# Patient Record
Sex: Male | Born: 1970 | Race: Black or African American | Hispanic: No | Marital: Single | Smoking: Current every day smoker
Health system: Southern US, Community
[De-identification: ages and names within clinical notes are randomized; demographics above are authoritative.]

---

## 1998-04-05 ENCOUNTER — Encounter: Admission: RE | Admit: 1998-04-05 | Discharge: 1998-04-05 | Payer: Self-pay | Admitting: Family Medicine

## 2000-09-29 ENCOUNTER — Emergency Department (HOSPITAL_COMMUNITY): Admission: EM | Admit: 2000-09-29 | Discharge: 2000-09-29 | Payer: Self-pay | Admitting: Emergency Medicine

## 2002-04-26 ENCOUNTER — Encounter: Payer: Self-pay | Admitting: Orthopedic Surgery

## 2002-04-26 ENCOUNTER — Inpatient Hospital Stay (HOSPITAL_COMMUNITY): Admission: EM | Admit: 2002-04-26 | Discharge: 2002-04-27 | Payer: Self-pay | Admitting: Emergency Medicine

## 2002-04-26 ENCOUNTER — Encounter: Payer: Self-pay | Admitting: Emergency Medicine

## 2002-07-21 ENCOUNTER — Encounter: Admission: RE | Admit: 2002-07-21 | Discharge: 2002-10-10 | Payer: Self-pay | Admitting: Orthopedic Surgery

## 2007-02-15 ENCOUNTER — Emergency Department (HOSPITAL_COMMUNITY): Admission: EM | Admit: 2007-02-15 | Discharge: 2007-02-15 | Payer: Self-pay | Admitting: Emergency Medicine

## 2013-02-10 ENCOUNTER — Emergency Department (INDEPENDENT_AMBULATORY_CARE_PROVIDER_SITE_OTHER)
Admission: EM | Admit: 2013-02-10 | Discharge: 2013-02-10 | Disposition: A | Discharge status: Nursing Home (Includes ICF) | Payer: Self-pay | Source: Home / Self Care | Attending: Emergency Medicine | Admitting: Emergency Medicine

## 2013-02-10 ENCOUNTER — Emergency Department (HOSPITAL_COMMUNITY)
Admission: EM | Admit: 2013-02-10 | Discharge: 2013-02-10 | Disposition: A | Discharge status: Home / Self Care | Payer: Self-pay | Attending: Emergency Medicine | Admitting: Emergency Medicine

## 2013-02-10 ENCOUNTER — Encounter (HOSPITAL_COMMUNITY): Payer: Self-pay | Admitting: Emergency Medicine

## 2013-02-10 DIAGNOSIS — J36 Peritonsillar abscess: Secondary | ICD-10-CM | POA: Insufficient documentation

## 2013-02-10 DIAGNOSIS — F17200 Nicotine dependence, unspecified, uncomplicated: Secondary | ICD-10-CM | POA: Insufficient documentation

## 2013-02-10 LAB — BASIC METABOLIC PANEL
BUN: 9 mg/dL (ref 6–23)
CO2: 27 mEq/L (ref 19–32)
Calcium: 9.8 mg/dL (ref 8.4–10.5)
Chloride: 100 mEq/L (ref 96–112)
Creatinine, Ser: 0.82 mg/dL (ref 0.50–1.35)
GFR calc Af Amer: >90 mL/min (ref >90)
GFR calc non Af Amer: >90 mL/min (ref >90)
Glucose, Bld: 82 mg/dL (ref 70–99)
Potassium: 4.4 mEq/L (ref 3.5–5.1)
Sodium: 137 mEq/L (ref 135–145)

## 2013-02-10 LAB — CBC WITH DIFFERENTIAL/PLATELET
Basophils Absolute: 0.1 10*3/uL (ref 0.0–0.1)
Basophils Relative: 1 % (ref 0–1)
Eosinophils Absolute: 0.1 10*3/uL (ref 0.0–0.7)
Eosinophils Relative: 0 % (ref 0–5)
HCT: 46.6 % (ref 39.0–52.0)
Hemoglobin: 16.1 g/dL (ref 13.0–17.0)
Lymphocytes Relative: 26 % (ref 12–46)
Lymphs Abs: 2.9 10*3/uL (ref 0.7–4.0)
MCH: 28.4 pg (ref 26.0–34.0)
MCHC: 34.5 g/dL (ref 30.0–36.0)
MCV: 82.3 fL (ref 78.0–100.0)
Monocytes Absolute: 1.1 10*3/uL — ABNORMAL HIGH (ref 0.1–1.0)
Monocytes Relative: 10 % (ref 3–12)
Neutro Abs: 7.3 10*3/uL (ref 1.7–7.7)
Neutrophils Relative %: 64 % (ref 43–77)
Platelets: 460 10*3/uL — ABNORMAL HIGH (ref 150–400)
RBC: 5.66 MIL/uL (ref 4.22–5.81)
RDW: 13.7 % (ref 11.5–15.5)
WBC: 11.5 10*3/uL — ABNORMAL HIGH (ref 4.0–10.5)

## 2013-02-10 LAB — POCT RAPID STREP A: Streptococcus, Group A Screen (Direct): NEGATIVE

## 2013-02-10 MED ORDER — DEXAMETHASONE SODIUM PHOSPHATE 10 MG/ML IJ SOLN: 10.0000 mg | Freq: Once | INTRAMUSCULAR | Status: DC

## 2013-02-10 MED ORDER — CLINDAMYCIN PHOSPHATE 600 MG/50ML IV SOLN: 600.0000 mg | Freq: Once | INTRAVENOUS | Status: DC

## 2013-02-10 MED ORDER — CHLORHEXIDINE GLUCONATE 0.12 % MT SOLN
15.0000 mL | Freq: Four times a day (QID) | OROMUCOSAL | Status: DC
  Administered 2013-02-10: 15 mL via OROMUCOSAL
  Filled 2013-02-10 (×2): qty 15

## 2013-02-10 MED ORDER — SODIUM CHLORIDE 0.9 % IV SOLN: 3.0000 g | Freq: Once | INTRAVENOUS | Status: DC

## 2013-02-10 NOTE — ED Provider Notes (Signed)
Pt transferred to ED from [facility] for management of L peritonsillar abscess.  Has had a left-sided sore throat w/ associated odynophagia/dysphagia and tactile fever x 1 week.  Has not taken anything for symptoms at home.  On exam, afebrile, edema, erythema and exudate of L tonsil, no trismus, no cervical lymphadenopathy and no respiratory distress.  Dr. Emeline Darling has performed I&D, prescribed clindamycin, hydrocodone and zofran and instructed for f/u in 1-2 weeks with himself.   Pt understands the plan.  D/c'd home.  8:15 PM   Otilio Miu, PA-C 02/10/13 2015

## 2013-02-10 NOTE — ED Notes (Addendum)
PT sent from [facility] with one week of sore throat, greater on left than right. Woke up this AM and was unable to swallow. Sent over to R/O peritonsilor abscess. Airway intact, even, unlabored respirations. Denies fevers.  Negative strep at [facility].

## 2013-02-10 NOTE — ED Notes (Signed)
Reports: sore throat x 1 wk that is gradually getting worse. Pt states "was sick last with with flu like symptoms"  Pt has tried otc meds with no relief.

## 2013-02-10 NOTE — ED Provider Notes (Signed)
History     CSN: [id number]  Arrival date & time 02/10/13  1408   First MD Initiated Contact with Patient 02/10/13 1500      Chief Complaint  Patient presents with  . Sore Throat    X 1WK    (Consider location/radiation/quality/duration/timing/severity/associated sxs/prior treatment) HPI Comments: Patient presents urgent care describing that he's been sick for almost 2 weeks but has been having a sore throat for more than a week now and last 2-3 days having difficulty swallowing and with moderate to severe pain. Has been having some tactile fevers." "Have a large bump on left side of my neck", today has not been able to drink or eat anything because of the pain  also at except for and the swelling in my throat"   Patient is a 42 y.o. male presenting with pharyngitis. The history is provided by the patient.  Sore Throat This is a new problem. The problem has been rapidly worsening. Pertinent negatives include no abdominal pain, no headaches and no shortness of breath. The symptoms are aggravated by swallowing. Nothing relieves the symptoms.    History reviewed. No pertinent past medical history.  History reviewed. No pertinent past surgical history.  History reviewed. No pertinent family history.  History  Substance Use Topics  . Smoking status: Current Every Day Smoker -- 1.00 packs/day    Types: Cigarettes  . Smokeless tobacco: Not on file  . Alcohol Use: Yes      Review of Systems  Constitutional: Positive for fever, chills, activity change and appetite change. Negative for fatigue.  Respiratory: Negative for shortness of breath.   Gastrointestinal: Negative for abdominal pain.  Skin: Negative for rash.  Neurological: Negative for dizziness, speech difficulty, numbness and headaches.  Hematological: Positive for adenopathy. Does not bruise/bleed easily.    Allergies  Review of patient's allergies indicates no known allergies.  Home Medications  No current  outpatient prescriptions on file.  BP 119/62  Pulse 78  Temp(Src) 99 F (37.2 C) (Oral)  Resp 12  SpO2 100%  Physical Exam  Nursing note and vitals reviewed. Constitutional: He appears distressed.  HENT:  Mouth/Throat: Uvula is midline. Oropharyngeal exudate, posterior oropharyngeal edema, posterior oropharyngeal erythema and tonsillar abscesses present.    Neurological: He is alert.  Skin: No rash noted. No erythema.    ED Course  Procedures (including critical care time)  Labs Reviewed  POCT RAPID STREP A ([facility]RG CARE ONLY)   No results found.   1. Peritonsillar abscess       MDM  Patient with a left peritonsillar suspected abscess. Positive trismus- have contacted ENT provider on call to alerted will be "paged -texted by his office. Patient advised not to drink or eat anything and will see him and evaluated in the emergency department. Patient is hemodynamically stable. Rapid Strep test was negative      Jimmie Molly, MD 02/10/13 1724

## 2013-02-10 NOTE — Consult Note (Signed)
Emir, Nack [id number] [birth year] No att. providers found  Reason for Consult: left peritonsillar abscess  HPI: patient sent from [facility] urgent care for several days of progressive left sore throat and odynophagia/dysphagia. Exam suspicious for left peritonsillar abscess so ENT consulted.  Allergies: No Known Allergies  ROS: sore throat, odynophagia, dysphagia, otherwise negative x 10 systems except per HPI.  PMH: No past medical history on file.  FH: No family history on file.  SH:  History   Social History  . Marital Status: Single    Spouse Name: N/A    Number of Children: N/A  . Years of Education: N/A   Occupational History  . Not on file.   Social History Main Topics  . Smoking status: Current Every Day Smoker -- 1.00 packs/day    Types: Cigarettes  . Smokeless tobacco: Not on file  . Alcohol Use: Yes  . Drug Use: No  . Sexually Active: Yes    Birth Control/ Protection: Condom   Other Topics Concern  . Not on file   Social History Narrative  . No narrative on file    PSH: No past surgical history on file.  Physical  Exam: CN 2-12 grossly intact and symmetric. Minimal hot-potato voice no stridor. EAC/TMs normal BL. Oral cavity with cryptic 2-3+ tonsils with left bulging anterior tonsillar pillar/soft palate consistent with left peritonsillar abscess. Skin warm and dry. Nasal cavity without polyps or purulence. External nose and ears without masses or lesions. EOMI, PERRLA. Neck supple with no masses or lesions.  Procedure Note: 42700-left. Informed verbal consent was obtained after explaining the risks (including bleeding and infection), benefits and alternatives of the procedure. Verbal timeout was performed prior to the procedure. The oral cavity was anesthetized using topical benzocaine followed by injected 2% lidocaine with epinephrine. A needle and syringe were used to localize the abscess on the left side, with no frank pus but ~0.60mL of phlegmon drained.  The 15 blade was then used to incise the palatoglossus and mucosa on the left and a curved hemostat was used to widely open the abscess and the left peritonsillar space. The patient tolerated the procedure with no immediate complications and was able to take PO prior to discharge.  A/P: left peritonsillar abscess s/p incision and drainage. I gave him prescriptions for hydrocodone/acetaminophen liquid, clindamycin liquid, and Zofran ODT. Follow up with Dr. Emeline Darling in 1-2 weeks, can consider tonsillectomy in future if has recurrent abscess or persistent symptoms.   Melvenia Beam 02/10/2013 7:04 PM

## 2013-02-11 NOTE — ED Provider Notes (Signed)
Medical screening examination/treatment/procedure(s) were performed by non-physician practitioner and as supervising physician I was immediately available for consultation/collaboration.   Gwyneth Sprout, MD 02/11/13 [phone]

## 2013-02-24 ENCOUNTER — Other Ambulatory Visit (HOSPITAL_COMMUNITY)
Admission: RE | Admit: 2013-02-24 | Discharge: 2013-02-24 | Disposition: A | Discharge status: Home / Self Care | Payer: Self-pay | Source: Ambulatory Visit | Attending: Otolaryngology | Admitting: Otolaryngology

## 2013-02-24 ENCOUNTER — Other Ambulatory Visit: Payer: Self-pay | Admitting: Otolaryngology

## 2013-02-24 DIAGNOSIS — R599 Enlarged lymph nodes, unspecified: Secondary | ICD-10-CM | POA: Insufficient documentation

## 2014-07-24 ENCOUNTER — Encounter (HOSPITAL_COMMUNITY): Payer: Self-pay | Admitting: Emergency Medicine

## 2014-07-24 ENCOUNTER — Emergency Department (HOSPITAL_COMMUNITY)
Admission: EM | Admit: 2014-07-24 | Discharge: 2014-07-24 | Disposition: A | Discharge status: Home / Self Care | Payer: Self-pay | Attending: Emergency Medicine | Admitting: Emergency Medicine

## 2014-07-24 DIAGNOSIS — J392 Other diseases of pharynx: Secondary | ICD-10-CM | POA: Insufficient documentation

## 2014-07-24 DIAGNOSIS — J020 Streptococcal pharyngitis: Secondary | ICD-10-CM | POA: Insufficient documentation

## 2014-07-24 DIAGNOSIS — F17200 Nicotine dependence, unspecified, uncomplicated: Secondary | ICD-10-CM | POA: Insufficient documentation

## 2014-07-24 DIAGNOSIS — J029 Acute pharyngitis, unspecified: Secondary | ICD-10-CM | POA: Insufficient documentation

## 2014-07-24 LAB — RAPID STREP SCREEN (MED CTR MEBANE ONLY): Streptococcus, Group A Screen (Direct): POSITIVE — AB

## 2014-07-24 MED ORDER — NAPROXEN 375 MG PO TABS: 375.0000 mg | ORAL_TABLET | Freq: Two times a day (BID) | ORAL | Status: DC

## 2014-07-24 MED ORDER — LIDOCAINE HCL 4 % EX SOLN: Freq: Once | CUTANEOUS | Status: DC

## 2014-07-24 MED ORDER — LIDOCAINE HCL 2 % IJ SOLN
5.0000 mL | Freq: Once | INTRAMUSCULAR | Status: AC
  Administered 2014-07-24: 100 mg
  Filled 2014-07-24: qty 20

## 2014-07-24 MED ORDER — DEXAMETHASONE 10 MG/ML FOR PEDIATRIC ORAL USE
10.0000 mg | Freq: Once | INTRAMUSCULAR | Status: AC
  Administered 2014-07-24: 10 mg via ORAL
  Filled 2014-07-24: qty 1

## 2014-07-24 MED ORDER — PENICILLIN G BENZATHINE 1200000 UNIT/2ML IM SUSP
2.4000 10*6.[IU] | Freq: Once | INTRAMUSCULAR | Status: AC
  Administered 2014-07-24: 2.4 10*6.[IU] via INTRAMUSCULAR
  Filled 2014-07-24: qty 4

## 2014-07-24 MED ORDER — KETOROLAC TROMETHAMINE 60 MG/2ML IM SOLN
60.0000 mg | Freq: Once | INTRAMUSCULAR | Status: AC
  Administered 2014-07-24: 60 mg via INTRAMUSCULAR
  Filled 2014-07-24: qty 2

## 2014-07-24 NOTE — Discharge Instructions (Signed)
Gargle and spit regularly.  Stay hydrated.  If you were given medicines take as directed.  If you are on coumadin or contraceptives realize their levels and effectiveness is altered by many different medicines.  If you have any reaction (rash, tongues swelling, other) to the medicines stop taking and see a physician.   Please follow up as directed and return to the ER or see a physician for new or worsening symptoms.  Thank you. Filed Vitals:   07/24/14 0551  BP: 127/68  Pulse: 80  Temp: 99.6 F (37.6 C)  TempSrc: Oral  Resp: 16  Weight: 148 lb (67.132 kg)  SpO2: 100%

## 2014-07-24 NOTE — ED Notes (Signed)
C/o sore throat, swelling and chills (subjective fever). No meds PTA. Onset Wednesday. No dyspnea noted.

## 2014-07-24 NOTE — ED Provider Notes (Signed)
CSN: [id number]     Arrival date & time 07/24/14  0547 History   First MD Initiated Contact with Patient 07/24/14 0550     Chief Complaint  Patient presents with  . Sore Throat     (Consider location/radiation/quality/duration/timing/severity/associated sxs/prior Treatment) HPI Comments: 43 year old male with history of strep throat, smoker, alcohol use presents with worsening sore throat since Wednesday. Patient has had strep throat twice in the past year. Patient has mild swelling sensation left neck. Pain with swallowing  Patient is a 43 y.o. male presenting with pharyngitis. The history is provided by the patient.  Sore Throat Pertinent negatives include no chest pain, no abdominal pain, no headaches and no shortness of breath.    History reviewed. No pertinent past medical history. History reviewed. No pertinent past surgical history. History reviewed. No pertinent family history. History  Substance Use Topics  . Smoking status: Current Every Day Smoker -- 1.00 packs/day    Types: Cigarettes  . Smokeless tobacco: Not on file  . Alcohol Use: Yes    Review of Systems  Constitutional: Negative for fever and chills.  HENT: Positive for sore throat. Negative for voice change.   Respiratory: Negative for shortness of breath.   Cardiovascular: Negative for chest pain.  Gastrointestinal: Negative for vomiting and abdominal pain.  Genitourinary: Negative for dysuria and flank pain.  Musculoskeletal: Negative for back pain, neck pain and neck stiffness.  Skin: Negative for rash.  Neurological: Negative for light-headedness and headaches.      Allergies  Review of patient's allergies indicates no known allergies.  Home Medications   Prior to Admission medications   Not on File   BP 127/68  Pulse 80  Temp(Src) 99.6 F (37.6 C) (Oral)  Resp 16  Wt 148 lb (67.132 kg)  SpO2 100% Physical Exam  Nursing note and vitals reviewed. Constitutional: He is oriented to person,  place, and time. He appears well-developed and well-nourished.  HENT:  Head: Normocephalic and atraumatic.  Mild left posterior pharyngeal edema. No trismus. No drooling. Neck supple with left anterior tender cervical lymphadenopathy. No tongue swelling.  Eyes: Right eye exhibits no discharge. Left eye exhibits no discharge.  Neck: Normal range of motion. Neck supple. No tracheal deviation present.  Cardiovascular: Normal rate.   Pulmonary/Chest: Effort normal.  Abdominal: Soft.  Musculoskeletal: He exhibits no edema.  Neurological: He is alert and oriented to person, place, and time.  Skin: Skin is warm. No rash noted.  Psychiatric: He has a normal mood and affect.    ED Course  Procedures (including critical care time) Abscess needle aspiration/peritonsillar swelling Indication acute pharyngitis, left posterior pharyngeal swelling Discussed risks and benefits, patient wishes to proceed Lidocaine neb followed by Hurricaine spray for comfort 18-gauge needle with safety cut at 1 cm used, 2 attempts left posterior pharyngeal region, no significant pus obtained. Patient tolerated well. Labs Review Labs Reviewed  RAPID STREP SCREEN - Abnormal; Notable for the following:    Streptococcus, Group A Screen (Direct) POSITIVE (*)    All other components within normal limits    Imaging Review No results found.   EKG Interpretation None      MDM   Final diagnoses:  Pharyngeal edema  Strep pharyngitis   Clinically strep throat confirmed by strep test. With left posterior pharyngeal swelling and mild deviation of uvula concern for early abscess. Discussed needle aspiration to look for pus. IM pnc antibiotics given. Lidocaine neb, pain meds ordered. ENT cart at bedside.  Needle aspiration left  posterior pharynx performed, no significant pus obtained. Patient well-appearing in ER followup outpatient discussed.  Results and differential diagnosis were discussed with the  patient/parent/guardian. Close follow up outpatient was discussed, comfortable with the plan.   Medications  dexamethasone (DECADRON) 10 MG/ML injection for Pediatric ORAL use 10 mg (not administered)  lidocaine (XYLOCAINE) 2 % (with pres) injection 100 mg (not administered)  penicillin g benzathine (BICILLIN LA) 1200000 UNIT/2ML injection 2.4 Million Units (2.4 Million Units Intramuscular Given 07/24/14 0636)  ketorolac (TORADOL) injection 60 mg (60 mg Intramuscular Given 07/24/14 0637)    Filed Vitals:   07/24/14 0551  BP: 127/68  Pulse: 80  Temp: 99.6 F (37.6 C)  TempSrc: Oral  Resp: 16  Weight: 148 lb (67.132 kg)  SpO2: 100%       Enid Skeens, MD 07/24/14 [phone]

## 2017-03-06 ENCOUNTER — Emergency Department (HOSPITAL_COMMUNITY)
Admission: EM | Admit: 2017-03-06 | Discharge: 2017-03-07 | Disposition: A | Discharge status: Home / Self Care | Payer: Self-pay | Attending: Emergency Medicine | Admitting: Emergency Medicine

## 2017-03-06 ENCOUNTER — Encounter (HOSPITAL_COMMUNITY): Payer: Self-pay | Admitting: Oncology

## 2017-03-06 DIAGNOSIS — F17210 Nicotine dependence, cigarettes, uncomplicated: Secondary | ICD-10-CM | POA: Insufficient documentation

## 2017-03-06 DIAGNOSIS — J36 Peritonsillar abscess: Secondary | ICD-10-CM | POA: Insufficient documentation

## 2017-03-06 NOTE — ED Triage Notes (Signed)
Pt c/o sore throat x one week.  Pt states he has tried OTC medication w/o relief.  Rates pain 8/10.

## 2017-03-07 ENCOUNTER — Encounter (HOSPITAL_COMMUNITY): Payer: Self-pay | Admitting: Radiology

## 2017-03-07 ENCOUNTER — Emergency Department (HOSPITAL_COMMUNITY): Payer: Self-pay

## 2017-03-07 LAB — CBC WITH DIFFERENTIAL/PLATELET
Basophils Absolute: 0 10*3/uL (ref 0.0–0.1)
Basophils Relative: 0 %
EOS ABS: 0.2 10*3/uL (ref 0.0–0.7)
EOS PCT: 2 %
HCT: 43.7 % (ref 39.0–52.0)
HEMOGLOBIN: 15 g/dL (ref 13.0–17.0)
LYMPHS ABS: 1.8 10*3/uL (ref 0.7–4.0)
Lymphocytes Relative: 16 %
MCH: 27.8 pg (ref 26.0–34.0)
MCHC: 34.3 g/dL (ref 30.0–36.0)
MCV: 81.1 fL (ref 78.0–100.0)
Monocytes Absolute: 1.5 10*3/uL — ABNORMAL HIGH (ref 0.1–1.0)
Monocytes Relative: 13 %
NEUTROS PCT: 69 %
Neutro Abs: 7.8 10*3/uL — ABNORMAL HIGH (ref 1.7–7.7)
PLATELETS: 448 10*3/uL — AB (ref 150–400)
RBC: 5.39 MIL/uL (ref 4.22–5.81)
RDW: 13.5 % (ref 11.5–15.5)
WBC: 11.2 10*3/uL — ABNORMAL HIGH (ref 4.0–10.5)

## 2017-03-07 LAB — COMPREHENSIVE METABOLIC PANEL
ALBUMIN: 4.2 g/dL (ref 3.5–5.0)
ALK PHOS: 101 U/L (ref 38–126)
ALT: 16 U/L — AB (ref 17–63)
AST: 18 U/L (ref 15–41)
Anion gap: 10 (ref 5–15)
BUN: 10 mg/dL (ref 6–20)
CALCIUM: 9.5 mg/dL (ref 8.9–10.3)
CHLORIDE: 102 mmol/L (ref 101–111)
CO2: 26 mmol/L (ref 22–32)
CREATININE: 1.02 mg/dL (ref 0.61–1.24)
GFR calc Af Amer: >60 mL/min (ref >60)
GFR calc non Af Amer: >60 mL/min (ref >60)
GLUCOSE: 105 mg/dL — AB (ref 65–99)
Potassium: 3.7 mmol/L (ref 3.5–5.1)
SODIUM: 138 mmol/L (ref 135–145)
Total Bilirubin: 0.3 mg/dL (ref 0.3–1.2)
Total Protein: 8.4 g/dL — ABNORMAL HIGH (ref 6.5–8.1)

## 2017-03-07 MED ORDER — DEXAMETHASONE SODIUM PHOSPHATE 10 MG/ML IJ SOLN
10.0000 mg | Freq: Once | INTRAMUSCULAR | Status: AC
  Administered 2017-03-07: 10 mg via INTRAVENOUS
  Filled 2017-03-07: qty 1

## 2017-03-07 MED ORDER — HYDROMORPHONE HCL 2 MG/ML IJ SOLN
0.5000 mg | Freq: Once | INTRAMUSCULAR | Status: AC
  Administered 2017-03-07: 0.5 mg via INTRAVENOUS
  Filled 2017-03-07: qty 1

## 2017-03-07 MED ORDER — HYDROCODONE-ACETAMINOPHEN 7.5-325 MG/15ML PO SOLN: 10.0000 mL | Freq: Four times a day (QID) | ORAL | 0 refills | Status: AC | PRN

## 2017-03-07 MED ORDER — CLINDAMYCIN HCL 150 MG PO CAPS: 300.0000 mg | ORAL_CAPSULE | Freq: Three times a day (TID) | ORAL | 0 refills | Status: AC

## 2017-03-07 MED ORDER — IOPAMIDOL (ISOVUE-300) INJECTION 61%
75.0000 mL | Freq: Once | INTRAVENOUS | Status: AC | PRN
  Administered 2017-03-07: 75 mL via INTRAVENOUS

## 2017-03-07 MED ORDER — CLINDAMYCIN PHOSPHATE 900 MG/50ML IV SOLN
900.0000 mg | Freq: Once | INTRAVENOUS | Status: AC
  Administered 2017-03-07: 900 mg via INTRAVENOUS
  Filled 2017-03-07: qty 50

## 2017-03-07 MED ORDER — IOPAMIDOL (ISOVUE-300) INJECTION 61%
INTRAVENOUS | Status: AC
  Filled 2017-03-07: qty 75

## 2017-03-07 NOTE — Discharge Instructions (Signed)
It is important to follow up with Dr. Suszanne Conners next week by calling his office to request a time to be seen Monday or Tuesday. Return to the emergency department with any worsening symptoms - high fever, difficulty swallowing, drainage from the swollen area into the mouth.

## 2017-03-07 NOTE — ED Provider Notes (Signed)
[facility]-EMERGENCY DEPT Provider Note   CSN: [id number] Arrival date & time: 03/06/17  2205 By signing my name below, I, Levon Hedger, attest that this documentation has been prepared under the direction and in the presence of non-physician practitioner, Melvenia Beam A Rihan Schueler PA-C. Electronically Signed: Levon Hedger, Scribe. 03/07/2017. 1:30 AM.   History   Chief Complaint Chief Complaint  Patient presents with  . Sore Throat   HPI Daniel Keller is a 46 y.o. male who presents to the Emergency Department complaining of persistent, moderate sore throat onset five days ago. He reports associated swelling to the area and intermittent fever. Pt has taken OTC medication with no significant relief. Pain is exacerbated by swallowing. Pt is not immunocompromised.He denies any difficulty swallowing and has no other acute complaints at this time. NKDA.   The history is provided by the patient. No language interpreter was used.    History reviewed. No pertinent past medical history.  There are no active problems to display for this patient.   History reviewed. No pertinent surgical history.   Home Medications    Prior to Admission medications   Not on File   Family History No family history on file.  Social History Social History  Substance Use Topics  . Smoking status: Current Every Day Smoker    Packs/day: 1.00    Types: Cigarettes  . Smokeless tobacco: Never Used  . Alcohol use Yes    Allergies   Patient has no known allergies.   Review of Systems Review of Systems  Constitutional: Negative for fever.  HENT: Positive for sore throat. Negative for congestion and trouble swallowing.   Respiratory: Negative for cough and shortness of breath.   Cardiovascular: Negative for chest pain.  Gastrointestinal: Negative for nausea.  Musculoskeletal: Negative for myalgias.  Allergic/Immunologic: Negative for immunocompromised state.   Physical Exam Updated Vital Signs BP 108/78 (BP  Location: Left Arm)   Pulse 94   Temp 99.4 F (37.4 C) (Oral)   Resp 18   Ht  (1.803 m)   Wt 148 lb (67.1 kg)   SpO2 97%   BMI 20.64 kg/m   Physical Exam  Constitutional: He is oriented to person, place, and time. He appears well-developed and well-nourished. No distress.  HENT:  Head: Normocephalic and atraumatic.  Significant left peritonsillar swelling with uvular shift. No tonsillar exudates. There is mild trismus.  Eyes: Conjunctivae are normal.  Neck:  Left lateral neck swelling with cervical adenopathy  Cardiovascular: Normal rate.   Pulmonary/Chest: Effort normal. No respiratory distress.  Abdominal: He exhibits no distension.  Neurological: He is alert and oriented to person, place, and time.  Skin: Skin is warm and dry.  Psychiatric: He has a normal mood and affect.  Nursing note and vitals reviewed.  ED Treatments / Results  DIAGNOSTIC STUDIES:  Oxygen Saturation is 97% on RA, normal by my interpretation.    COORDINATION OF CARE:  1:30 AM Discussed treatment plan which includes imaging and pain medication with pt at bedside and pt agreed to plan.   Labs (all labs ordered are listed, but only abnormal results are displayed) Labs Reviewed - No data to display  EKG  EKG Interpretation None       Radiology No results found.  Procedures Procedures (including critical care time)  Medications Ordered in ED Medications - No data to display   Initial Impression / Assessment and Plan / ED Course  I have reviewed the triage vital signs and the nursing notes.  Pertinent labs & imaging results that were available during my care of the patient were reviewed by me and considered in my medical decision making (see chart for details).     Patient has significant throat soreness, peritonsillar swelling and uvular shift. There is swelling that extends into the lateral neck. CT scan soft tissue neck ordered for full evaluation. No obstruction to swallowing  although painful.   CT scan shows:   IMPRESSION: 1. 1.9 x 1.2 x 2.4 cm left tonsillar/peritonsillar abscess. Associated extensive inflammatory stranding and swelling throughout the left parapharyngeal and submandibular spaces, with reactive retropharyngeal effusion and left-sided pharyngeal mucosal edema as above. While this abscess is felt to most likely be tonsillar in nature, the possibility is considered that this could be odontogenic in origin, extending posteriorly from several prominent dental caries at the posterior left mandibular molars. Exact delineation somewhat difficult on this exam due to extensive motion artifact. Correlation with physical exam recommended. 2. Reactive left-sided cervical adenopathy as above. 3. Poor dentition.  Patient is more comfortable after pain medication and Decadron. IV Clinda provided. Dr. Suszanne Conners (ENT) paged for consultation for likely peritonsillar abscess.   Reviewed CT results/images with Dr. Suszanne Conners. He advises the patient can receive another 10 mg Decadron, and is stable to discharge home with Rx Clinda with office follow up next week. Patient updated on recommendations and is comfortable with discharge home.  Final Clinical Impressions(s) / ED Diagnoses   Final diagnoses:  None   1. Left peritonsillar abscess  New Prescriptions New Prescriptions   No medications on file   I personally performed the services described in this documentation, which was scribed in my presence. The recorded information has been reviewed and is accurate.      Elpidio Anis, PA-C 03/07/17 1610    April Palumbo, MD 03/07/17 [phone]

## 2017-03-10 ENCOUNTER — Emergency Department (HOSPITAL_COMMUNITY)
Admission: EM | Admit: 2017-03-10 | Discharge: 2017-03-10 | Disposition: A | Discharge status: Home / Self Care | Payer: Self-pay | Attending: Emergency Medicine | Admitting: Emergency Medicine

## 2017-03-10 ENCOUNTER — Encounter (HOSPITAL_COMMUNITY): Payer: Self-pay | Admitting: *Deleted

## 2017-03-10 DIAGNOSIS — J36 Peritonsillar abscess: Secondary | ICD-10-CM | POA: Insufficient documentation

## 2017-03-10 DIAGNOSIS — F17210 Nicotine dependence, cigarettes, uncomplicated: Secondary | ICD-10-CM | POA: Insufficient documentation

## 2017-03-10 MED ORDER — SODIUM CHLORIDE 0.9 % IV SOLN
3.0000 g | Freq: Once | INTRAVENOUS | Status: AC
  Administered 2017-03-10: 3 g via INTRAVENOUS
  Filled 2017-03-10: qty 3

## 2017-03-10 MED ORDER — DEXAMETHASONE SODIUM PHOSPHATE 10 MG/ML IJ SOLN
20.0000 mg | Freq: Once | INTRAMUSCULAR | Status: AC
  Administered 2017-03-10: 20 mg via INTRAMUSCULAR
  Filled 2017-03-10: qty 2

## 2017-03-10 NOTE — ED Provider Notes (Signed)
[facility]-EMERGENCY DEPT Provider Note   CSN: [id number] Arrival date & time: 03/10/17  1031  By signing my name below, I, Marnette Burgess Long, attest that this documentation has been prepared under the direction and in the presence of Rolan Bucco, MD. Electronically Signed: Christian Mate, Scribe. 03/10/2017. 12:23 PM.  History   Chief Complaint Chief Complaint  Patient presents with  . Sore   The history is provided by the patient and medical records. No language interpreter was used.  HPI Comments:  Daniel Keller is a 46 y.o. male who presents to the Emergency Department complaining of persistent sore throat with 7/10 pain onset one week ago. He states he was seen at [facility] four days ago for same with no improvement since that time. Last night, he states he had "some difficulty breathing" that is resolved at this time and continued pain with difficulty swallowing. He is tolerating secretions and fluids well. This difficulty swallowing has not worsened or improved since the original onset of the sore throat. He reports an associated symptom of some left sided throat swelling. He tried the Rx'd Clindamycin at home with no improvement. No alleviating factors noted. Pt denies fever, rhinorrhea, congestion, cough, vomiting, diarrhea, nausea, dysuria, hematuria, rash, abdominal pain, and any other complaints at this time. Pt is a current every day smoker.    History reviewed. No pertinent past medical history.  There are no active problems to display for this patient.  History reviewed. No pertinent surgical history.  Home Medications    Prior to Admission medications   Medication Sig Start Date End Date Taking? Authorizing Provider  clindamycin (CLEOCIN) 150 MG capsule Take 2 capsules (300 mg total) by mouth 3 (three) times daily. 03/07/17   Elpidio Anis, PA-C  HYDROcodone-acetaminophen (HYCET) 7.5-325 mg/15 ml solution Take 10-15 mLs by mouth 4 (four) times daily as needed for moderate pain.  03/07/17   Elpidio Anis, PA-C   Family History History reviewed. No pertinent family history.  Social History Social History  Substance Use Topics  . Smoking status: Current Every Day Smoker    Packs/day: 1.00    Types: Cigarettes  . Smokeless tobacco: Never Used  . Alcohol use Yes   Allergies   Patient has no known allergies.   Review of Systems Review of Systems  Constitutional: Negative for fever.  HENT: Positive for sore throat and trouble swallowing. Negative for congestion and rhinorrhea. Voice change: tolerating secretions well.   Respiratory: Negative for cough.   Gastrointestinal: Negative for abdominal pain, diarrhea, nausea and vomiting.  Genitourinary: Negative for dysuria and hematuria.  Skin: Negative for rash.     Physical Exam Updated Vital Signs BP 109/62 (BP Location: Right Arm)   Pulse 82   Temp 98.5 F (36.9 C) (Oral)   Resp 16   Ht  (1.803 m)   Wt 148 lb (67.1 kg)   SpO2 96%   BMI 20.64 kg/m   Physical Exam  Constitutional: He is oriented to person, place, and time. He appears well-developed and well-nourished.  HENT:  Head: Normocephalic and atraumatic.  Left peritonsillar fullness and some deviation of the uvula to the right.   Eyes: Pupils are equal, round, and reactive to light.  Neck: Normal range of motion. Neck supple.  Cardiovascular: Normal rate, regular rhythm and normal heart sounds.   Pulmonary/Chest: Effort normal and breath sounds normal. No respiratory distress. He has no wheezes. He has no rales. He exhibits no tenderness.  Abdominal: Soft. Bowel sounds are  normal. There is no tenderness. There is no rebound and no guarding.  Musculoskeletal: Normal range of motion. He exhibits no edema.  Lymphadenopathy:    He has no cervical adenopathy.  Neurological: He is alert and oriented to person, place, and time.  Skin: Skin is warm and dry. No rash noted.  Psychiatric: He has a normal mood and affect.     ED Treatments /  Results  DIAGNOSTIC STUDIES:  Oxygen Saturation is 96% on RA, adequate by my interpretation.    COORDINATION OF CARE:  12:13 PM Discussed treatment plan with pt at bedside including Decadron and Unasyn and pt agreed to plan.  12:26 PM Consult placed with ENT.   12:47 PM ENT confirmed he will come to the ED to assess the pt with potential I&D.  Labs (all labs ordered are listed, but only abnormal results are displayed) Labs Reviewed - No data to display  EKG  EKG Interpretation None       Radiology No results found.  Procedures Procedures (including critical care time)  Medications Ordered in ED Medications  Ampicillin-Sulbactam (UNASYN) 3 g in sodium chloride 0.9 % 100 mL IVPB (0 g Intravenous Stopped 03/10/17 1434)  dexamethasone (DECADRON) injection 20 mg (20 mg Intramuscular Given 03/10/17 1434)     Initial Impression / Assessment and Plan / ED Course  I have reviewed the triage vital signs and the nursing notes.  Pertinent labs & imaging results that were available during my care of the patient were reviewed by me and considered in my medical decision making (see chart for details).     Patient presents with throat pain. He had a recent CT scan showed a peritonsillar abscess. He states that his symptoms haven't improved at all since that time. He's been on clindamycin and also received Decadron. I spoke with Dr. Pollyann Kennedy who is on-call for ENT who suggested I call Dr. Suszanne Conners since Dr. Suszanne Conners was initially consulted on his prior visit. Dr. Suszanne Conners did evaluate the patient after his clinic hours and felt the patient did not need an I&D of the peritonsillar abscess. Patient will continue his clindamycin. He was given Decadron and Unasyn in the ED per Dr. Suszanne Conners. He was advised to follow-up in his office as needed or return here as needed for any worsening symptoms.  Final Clinical Impressions(s) / ED Diagnoses   Final diagnoses:  Peritonsillar abscess    New Prescriptions New  Prescriptions   No medications on file    I personally performed the services described in this documentation, which was scribed in my presence.  The recorded information has been reviewed and considered.     Rolan Bucco, MD 03/10/17 1726

## 2017-03-10 NOTE — ED Notes (Signed)
ENT in to see pt.

## 2017-03-10 NOTE — ED Notes (Signed)
Seen at [facility] on sat for same sorethroat and swelling on left side of throat states meds given did not work

## 2017-03-10 NOTE — ED Triage Notes (Signed)
c/o sore throat onset 1 week ago , states he was seen at [facility] on Sunday not getting better.

## 2017-03-10 NOTE — Consult Note (Signed)
Reason for Consult: Left peritonsillar abscess  HPI:  Daniel Keller is an 46 y.o. male who presents to the [facility] ER today for follow up evaluation of his left peritonsillar abscess. He states he was seen at [facility] four days ago for left sided sore throat. He was treated with IV decadron and clindamycin.  Last night, he states he had "some difficulty breathing" that is resolved at this time. He read on the internet that the PTA needs to be drained. Therefore he would like to have the PTA drained. He stopped his clindamycin yesterday. Pt denies fever, rhinorrhea, congestion, cough, vomiting, diarrhea, nausea, dysuria, hematuria, rash, abdominal pain, and any other complaints at this time. Pt is a current every day smoker.   History reviewed. No pertinent past medical history.  History reviewed. No pertinent surgical history.  History reviewed. No pertinent family history.  Social History:  reports that he has been smoking Cigarettes.  He has been smoking about 1.00 pack per day. He has never used smokeless tobacco. He reports that he drinks alcohol. He reports that he does not use drugs.  Allergies: No Known Allergies  Prior to Admission medications   Medication Sig Start Date End Date Taking? Authorizing Provider  clindamycin (CLEOCIN) 150 MG capsule Take 2 capsules (300 mg total) by mouth 3 (three) times daily. 03/07/17   Elpidio Anis, PA-C  HYDROcodone-acetaminophen (HYCET) 7.5-325 mg/15 ml solution Take 10-15 mLs by mouth 4 (four) times daily as needed for moderate pain. 03/07/17   Elpidio Anis, PA-C   Review of Systems  Constitutional: Negative for fever.  HENT: Positive for sore throat. Negative for congestion and rhinorrhea.  Respiratory: Negative for cough.   Gastrointestinal: Negative for abdominal pain, diarrhea, nausea and vomiting.  Genitourinary: Negative for dysuria and hematuria.  Skin: Negative for rash.   Blood pressure 109/62, pulse 82, temperature 98.5 F (36.9 C),  temperature source Oral, resp. rate 16, height  (1.803 m), weight 148 lb (67.1 kg), SpO2 96 %. Physical Exam  Constitutional: He is oriented to person, place, and time. He appears well-developed and well-nourished.  Head: Normocephalic and atraumatic.  Eyes: Pupils are equal, round, and reactive to light.  Ears: Normal auricles and EACs. Nose: Normal septum, turbinates, and mucosa. Mouth: Minimal left peritonsillar edema and erythema. No drooling. Voice is normal. Neck: Normal range of motion. Neck supple.  Cardiovascular: Normal rate, regular rhythm and normal heart sounds.   Pulmonary/Chest: Effort normal and breath sounds normal. No respiratory distress. He has no wheezes. He has no rales. He exhibits no tenderness.  Neurological: He is alert and oriented to person, place, and time.  Skin: Skin is warm and dry. No rash noted.  Psychiatric: He has a normal mood and affect.   Assessment/Plan: Pt's left PTA has significantly improved. He is reassured that I&D is not needed at this time. Complete his current course of clindamycin. Pt will likely need to undergo tonsillectomy in the future (this is his 3rd PTA).  [name] W Luba Matzen 03/10/2017, 5:24 PM

## 2018-05-14 IMAGING — CT CT NECK W/ CM
4 of 5 series · 15 of 33 positions shown, 17 images · IV contrast (iopamidol)
Comparison: None.

CLINICAL DATA: Initial evaluation for abscess.

EXAM:
CT NECK WITH CONTRAST
TECHNIQUE: Multidetector CT imaging of the neck was performed using the
standard protocol following the bolus administration of intravenous
contrast.
CONTRAST:  75mL MQJ0ET-JSS IOPAMIDOL (MQJ0ET-JSS) INJECTION 61%

[Series 3: neck with st · axial · 0.47mm/px · z∈[-279,-113]mm · 4 of 139 slices shown, 5 images]
[im 28/139  soft-tissue]
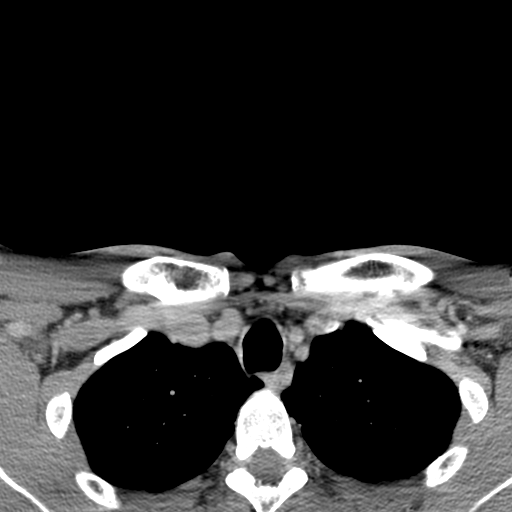
[im 28/139  bone]
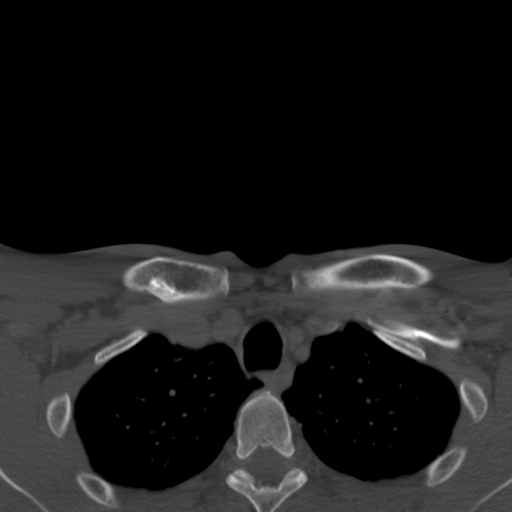
[im 56/139  bone]
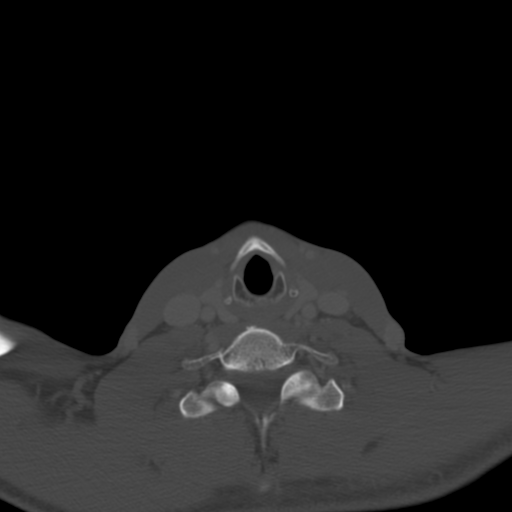
[im 83/139  bone]
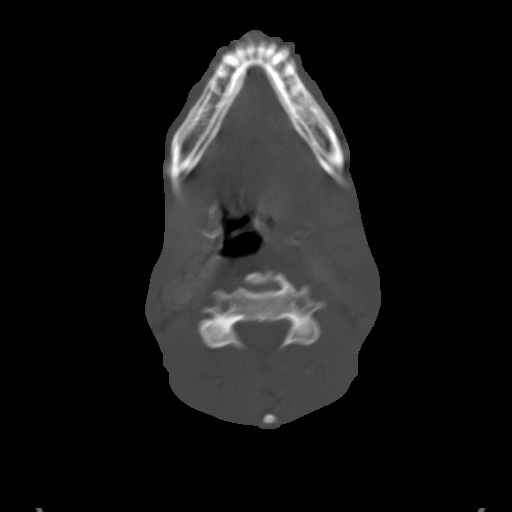
[im 111/139  bone]
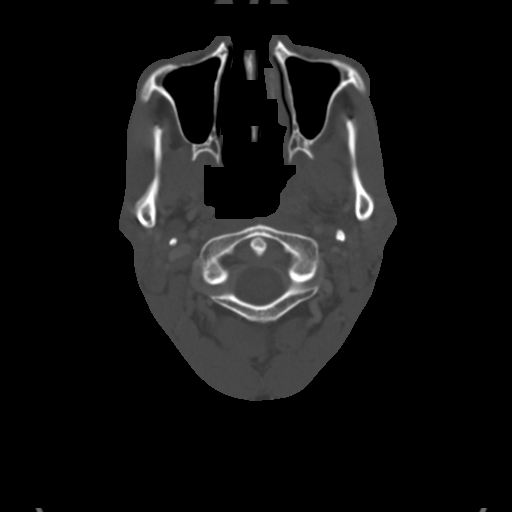

[Series 7: coronal st · coronal · 0.60mm/px · 3 of 97 slices shown]
[im 30/97  bone]
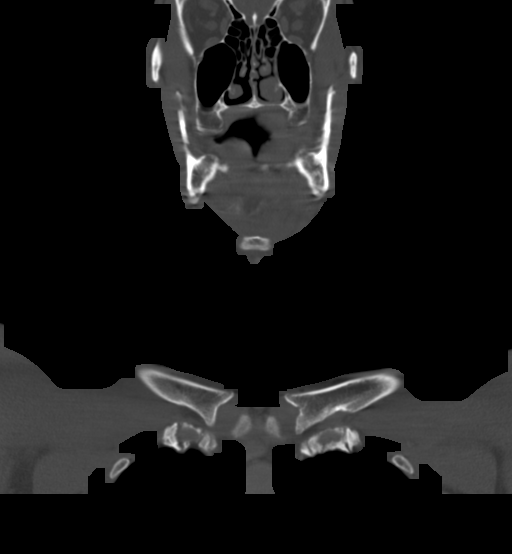
[im 42/97  bone]
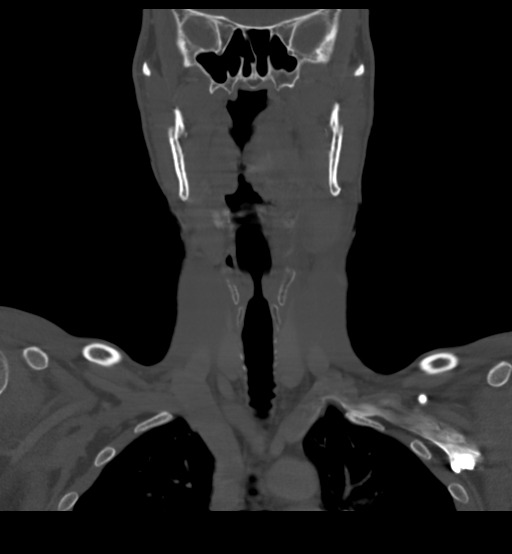
[im 54/97  bone]
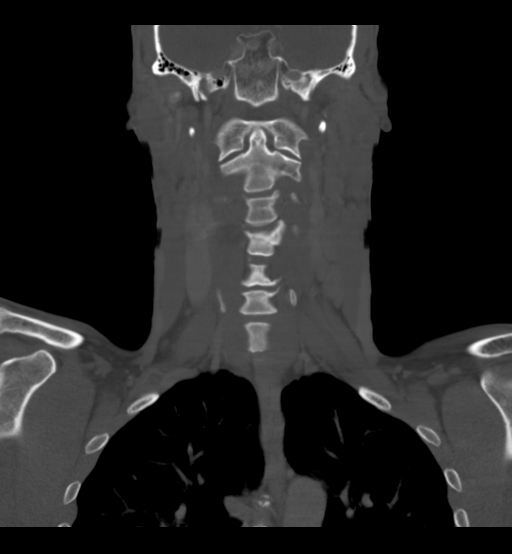

[Series 8: sagittal st · sagittal · 0.57mm/px · 5 of 82 slices shown, 6 images]
[im 28/82  bone]
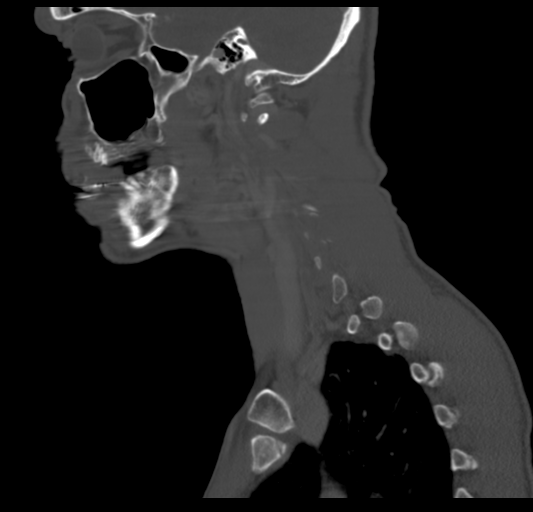
[im 34/82  bone]
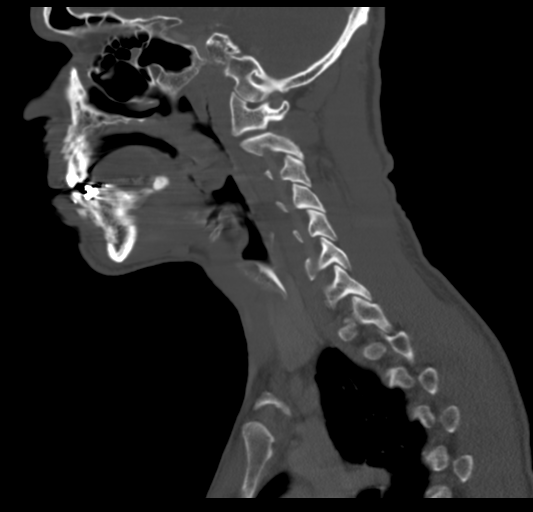
[im 41/82  soft-tissue]
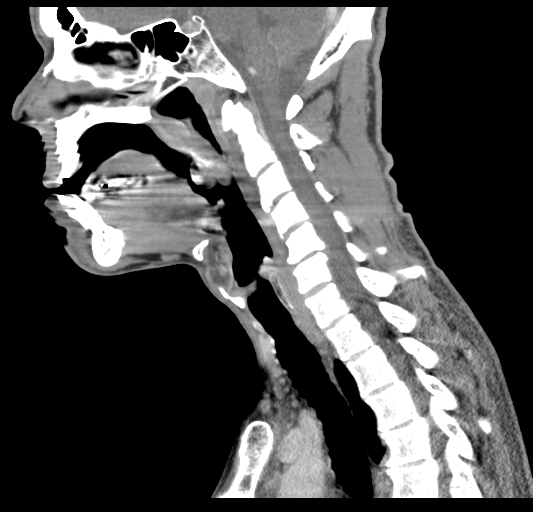
[im 41/82  bone]
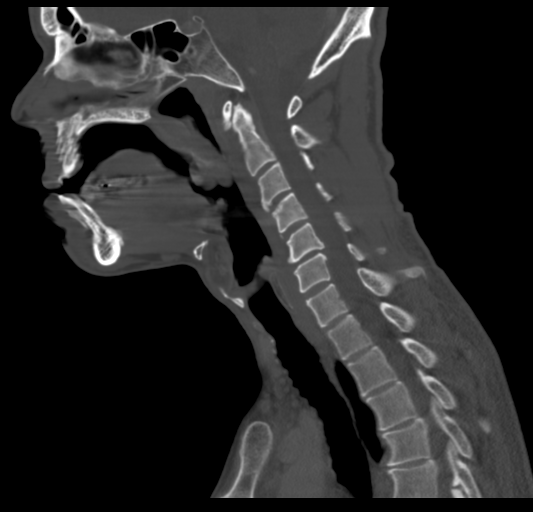
[im 48/82  bone]
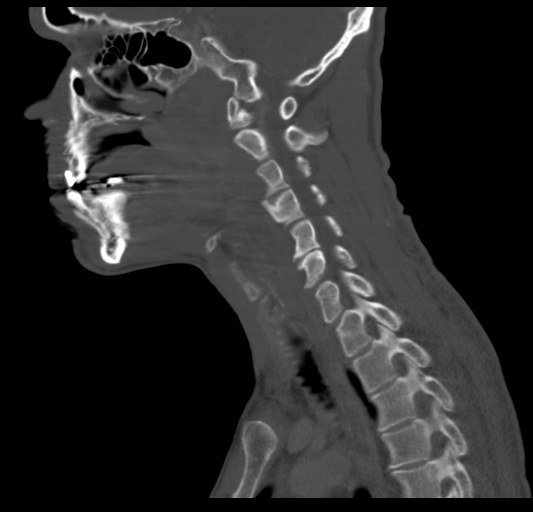
[im 55/82  bone]
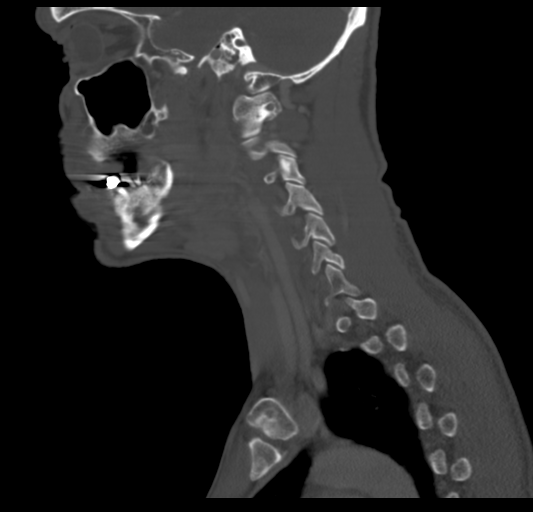

[Series 9: ax axial recons · axial · 0.39mm/px · z∈[-324,-214]mm · 3 of 150 slices shown]
[im 30/150  bone]
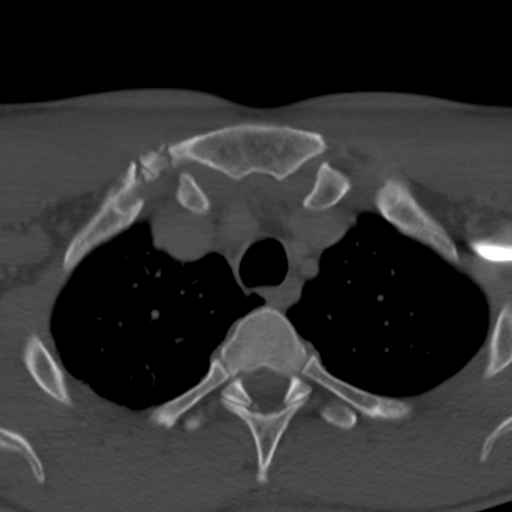
[im 60/150  bone]
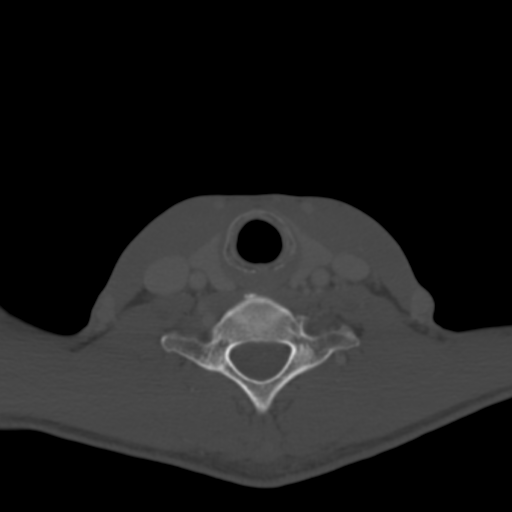
[im 90/150  bone]
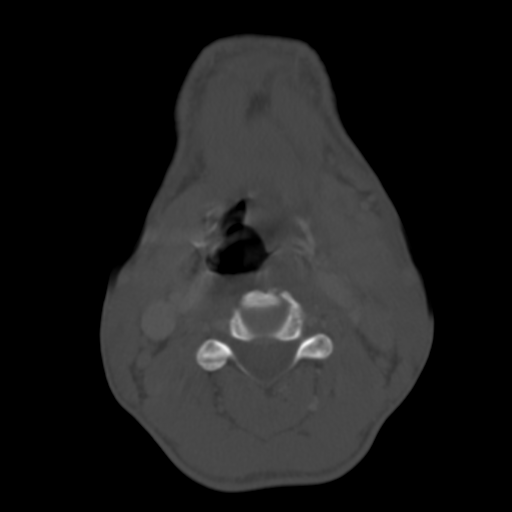

[15 of 33 positions shown; findings below may reference images not displayed]

FINDINGS: Pharynx and larynx: Study degraded by motion artifact.

Oral cavity grossly unremarkable without mass lesion or loculated
fluid collection. Scattered dental caries noted.

Asymmetric enlargement of the left palatine tonsil. There is an
irregular multi loculated tonsillar/peritonsillar collection
measuring 1.9 x 1.2 x 2.4 cm (Series 3, image 46), consistent with
abscess. While this may be related to acute tonsillitis, the
possibility is considered that this is odontogenic in origin
extending posteriorly across the retromolar trigone from prominent
dental caries at the left posterior mandibular molars, not well
evaluated on this motion degraded study. Associated inflammatory
stranding with induration within the left parapharyngeal fat and
left parapharyngeal space, extending inferiorly and laterally into
the left submandibular space. Asymmetric fullness within the left
nasopharynx. Associated mucosal edema with swelling within the left
pharynx and hypopharynx, extending inferiorly to the level of the
left piriform sinus. Reactive effusion within knee retropharyngeal
space without discrete abscess or collection. Relative sparing of
the right pharynx which is normal in appearance. Supraglottic airway
is mildly attenuated but remains widely patent at this time.
Epiglottis itself is grossly within normal limits, but not well
evaluated on this motion degraded study. True cords symmetric and
normal. Subglottic airway is clear.

Salivary glands: Parotid glands and right submandibular gland are
normal. Hazy inflammatory stranding surrounds the left submandibular
gland related to the adjacent inflammatory NOSILUS process within the
left parapharyngeal space.

Thyroid: Thyroid normal.

Lymph nodes: Enlarged left level [DATE] lymph nodes measure up to 2 cm
2 cm in short axis. Mildly prominent left level 1 B node measures 8
mm. Left retropharyngeal node measures 7 mm. Increased number of
left-sided cervical nodes as compared to the right. These are likely
reactive.

Vascular: Normal intravascular enhancement seen throughout the neck.

Limited intracranial: Unremarkable.

Visualized orbits: Visualized globes and orbital soft tissues within
normal limits.

Mastoids and visualized paranasal sinuses: Visualized paranasal
sinuses are clear. Mastoids are clear. Middle ear cavities well
pneumatized.

Skeleton: No acute osseous abnormality. No worrisome lytic or
blastic osseous lesions. Mild degenerative spondylolysis noted at
C3-4 and C5-6.

Upper chest: Visualized upper mediastinum within normal limits.
Visualized lungs are clear.

Other: None.
IMPRESSION: 1. 1.9 x 1.2 x 2.4 cm left tonsillar/peritonsillar abscess.
Associated extensive inflammatory stranding and swelling throughout
the left parapharyngeal and submandibular spaces, with reactive
retropharyngeal effusion and left-sided pharyngeal mucosal edema as
above. While this abscess is felt to most likely be tonsillar in
nature, the possibility is considered that this could be odontogenic
in origin, extending posteriorly from several prominent dental
caries at the posterior left mandibular molars. Exact delineation
somewhat difficult on this exam due to extensive motion artifact.
Correlation with physical exam recommended.
2. Reactive left-sided cervical adenopathy as above.
3. Poor dentition.

## 2020-07-13 ENCOUNTER — Other Ambulatory Visit: Payer: Self-pay

## 2020-07-13 ENCOUNTER — Emergency Department (HOSPITAL_COMMUNITY)
Admission: EM | Admit: 2020-07-13 | Discharge: 2020-07-13 | Disposition: A | Discharge status: Home / Self Care | Payer: Self-pay | Attending: Emergency Medicine | Admitting: Emergency Medicine

## 2020-07-13 ENCOUNTER — Encounter (HOSPITAL_COMMUNITY): Payer: Self-pay

## 2020-07-13 DIAGNOSIS — R1084 Generalized abdominal pain: Secondary | ICD-10-CM | POA: Insufficient documentation

## 2020-07-13 DIAGNOSIS — R112 Nausea with vomiting, unspecified: Secondary | ICD-10-CM | POA: Insufficient documentation

## 2020-07-13 DIAGNOSIS — Z5321 Procedure and treatment not carried out due to patient leaving prior to being seen by health care provider: Secondary | ICD-10-CM | POA: Insufficient documentation

## 2020-07-13 LAB — COMPREHENSIVE METABOLIC PANEL
ALT: 36 U/L (ref 0–44)
AST: 74 U/L — ABNORMAL HIGH (ref 15–41)
Albumin: 3.7 g/dL (ref 3.5–5.0)
Alkaline Phosphatase: 94 U/L (ref 38–126)
Anion gap: 11 (ref 5–15)
BUN: 14 mg/dL (ref 6–20)
CO2: 28 mmol/L (ref 22–32)
Calcium: 9.3 mg/dL (ref 8.9–10.3)
Chloride: 92 mmol/L — ABNORMAL LOW (ref 98–111)
Creatinine, Ser: 1.17 mg/dL (ref 0.61–1.24)
GFR calc Af Amer: >60 mL/min (ref >60)
GFR calc non Af Amer: >60 mL/min (ref >60)
Glucose, Bld: 128 mg/dL — ABNORMAL HIGH (ref 70–99)
Potassium: 4.4 mmol/L (ref 3.5–5.1)
Sodium: 131 mmol/L — ABNORMAL LOW (ref 135–145)
Total Bilirubin: 0.6 mg/dL (ref 0.3–1.2)
Total Protein: 7.6 g/dL (ref 6.5–8.1)

## 2020-07-13 LAB — URINALYSIS, ROUTINE W REFLEX MICROSCOPIC
Bilirubin Urine: NEGATIVE
Glucose, UA: NEGATIVE mg/dL
Ketones, ur: 20 mg/dL — AB
Leukocytes,Ua: NEGATIVE
Nitrite: NEGATIVE
Protein, ur: 100 mg/dL — AB
Specific Gravity, Urine: 1.031 — ABNORMAL HIGH (ref 1.005–1.030)
pH: 5 (ref 5.0–8.0)

## 2020-07-13 LAB — CBC
HCT: 54.6 % — ABNORMAL HIGH (ref 39.0–52.0)
Hemoglobin: 17.9 g/dL — ABNORMAL HIGH (ref 13.0–17.0)
MCH: 26.5 pg (ref 26.0–34.0)
MCHC: 32.8 g/dL (ref 30.0–36.0)
MCV: 80.8 fL (ref 80.0–100.0)
Platelets: 265 10*3/uL (ref 150–400)
RBC: 6.76 MIL/uL — ABNORMAL HIGH (ref 4.22–5.81)
RDW: 13.2 % (ref 11.5–15.5)
WBC: 2.8 10*3/uL — ABNORMAL LOW (ref 4.0–10.5)
nRBC: 0 % (ref 0.0–0.2)

## 2020-07-13 LAB — LIPASE, BLOOD: Lipase: 41 U/L (ref 11–51)

## 2020-07-13 NOTE — ED Triage Notes (Signed)
Pt states that he has been having generalized abd pain and n/v for the past several days.

## 2020-07-13 NOTE — ED Notes (Signed)
Called pt name x4 for VS recheck. No response from pt.  

## 2020-07-14 ENCOUNTER — Encounter (HOSPITAL_COMMUNITY): Payer: Self-pay

## 2020-07-14 ENCOUNTER — Other Ambulatory Visit: Payer: Self-pay

## 2020-07-14 DIAGNOSIS — D72819 Decreased white blood cell count, unspecified: Secondary | ICD-10-CM | POA: Insufficient documentation

## 2020-07-14 DIAGNOSIS — E871 Hypo-osmolality and hyponatremia: Secondary | ICD-10-CM | POA: Insufficient documentation

## 2020-07-14 DIAGNOSIS — Z20822 Contact with and (suspected) exposure to covid-19: Secondary | ICD-10-CM | POA: Insufficient documentation

## 2020-07-14 DIAGNOSIS — F17210 Nicotine dependence, cigarettes, uncomplicated: Secondary | ICD-10-CM | POA: Insufficient documentation

## 2020-07-14 DIAGNOSIS — J189 Pneumonia, unspecified organism: Secondary | ICD-10-CM | POA: Insufficient documentation

## 2020-07-14 NOTE — ED Triage Notes (Addendum)
Patient reports he has no eaten since Friday of last week. Says he also has right side rib cage pain since same day. Pain rated 6/10 in triage. Pain is intermittent.  Patient endorses cough and thick mucous with cough.

## 2020-07-15 ENCOUNTER — Encounter (HOSPITAL_COMMUNITY): Payer: Self-pay | Admitting: Emergency Medicine

## 2020-07-15 ENCOUNTER — Emergency Department (HOSPITAL_COMMUNITY): Payer: Self-pay

## 2020-07-15 ENCOUNTER — Emergency Department (HOSPITAL_COMMUNITY)
Admission: EM | Admit: 2020-07-15 | Discharge: 2020-07-15 | Disposition: A | Discharge status: Home / Self Care | Payer: Self-pay | Attending: Emergency Medicine | Admitting: Emergency Medicine

## 2020-07-15 DIAGNOSIS — E871 Hypo-osmolality and hyponatremia: Secondary | ICD-10-CM

## 2020-07-15 DIAGNOSIS — J189 Pneumonia, unspecified organism: Secondary | ICD-10-CM

## 2020-07-15 DIAGNOSIS — D72819 Decreased white blood cell count, unspecified: Secondary | ICD-10-CM

## 2020-07-15 LAB — CBC
HCT: 49.1 % (ref 39.0–52.0)
Hemoglobin: 16.4 g/dL (ref 13.0–17.0)
MCH: 26.7 pg (ref 26.0–34.0)
MCHC: 33.4 g/dL (ref 30.0–36.0)
MCV: 80 fL (ref 80.0–100.0)
Platelets: 323 10*3/uL (ref 150–400)
RBC: 6.14 MIL/uL — ABNORMAL HIGH (ref 4.22–5.81)
RDW: 13.2 % (ref 11.5–15.5)
WBC: 2.9 10*3/uL — ABNORMAL LOW (ref 4.0–10.5)
nRBC: 0 % (ref 0.0–0.2)

## 2020-07-15 LAB — COMPREHENSIVE METABOLIC PANEL
ALT: 33 U/L (ref 0–44)
AST: 38 U/L (ref 15–41)
Albumin: 3.7 g/dL (ref 3.5–5.0)
Alkaline Phosphatase: 97 U/L (ref 38–126)
Anion gap: 11 (ref 5–15)
BUN: 14 mg/dL (ref 6–20)
CO2: 28 mmol/L (ref 22–32)
Calcium: 8.9 mg/dL (ref 8.9–10.3)
Chloride: 94 mmol/L — ABNORMAL LOW (ref 98–111)
Creatinine, Ser: 0.95 mg/dL (ref 0.61–1.24)
GFR calc Af Amer: >60 mL/min (ref >60)
GFR calc non Af Amer: >60 mL/min (ref >60)
Glucose, Bld: 104 mg/dL — ABNORMAL HIGH (ref 70–99)
Potassium: 4.1 mmol/L (ref 3.5–5.1)
Sodium: 133 mmol/L — ABNORMAL LOW (ref 135–145)
Total Bilirubin: 0.6 mg/dL (ref 0.3–1.2)
Total Protein: 7.5 g/dL (ref 6.5–8.1)

## 2020-07-15 LAB — SARS CORONAVIRUS 2 BY RT PCR (HOSPITAL ORDER, PERFORMED IN ~~LOC~~ HOSPITAL LAB): SARS Coronavirus 2: NEGATIVE

## 2020-07-15 MED ORDER — ONDANSETRON HCL 4 MG/2ML IJ SOLN
4.0000 mg | Freq: Once | INTRAMUSCULAR | Status: AC
  Administered 2020-07-15: 4 mg via INTRAVENOUS

## 2020-07-15 MED ORDER — AMOXICILLIN 500 MG PO CAPS: 1000.0000 mg | ORAL_CAPSULE | Freq: Two times a day (BID) | ORAL | 0 refills | Status: AC

## 2020-07-15 MED ORDER — SODIUM CHLORIDE 0.9 % IV BOLUS
1000.0000 mL | Freq: Once | INTRAVENOUS | Status: AC
  Administered 2020-07-15: 1000 mL via INTRAVENOUS

## 2020-07-15 MED ORDER — DOXYCYCLINE HYCLATE 100 MG PO CAPS: 100.0000 mg | ORAL_CAPSULE | Freq: Three times a day (TID) | ORAL | 0 refills | Status: AC

## 2020-07-15 MED ORDER — ACETAMINOPHEN 325 MG PO TABS
650.0000 mg | ORAL_TABLET | Freq: Once | ORAL | Status: AC
  Administered 2020-07-15: 650 mg via ORAL
  Filled 2020-07-15: qty 2

## 2020-07-15 NOTE — Discharge Instructions (Signed)
You are being treated for pneumonia. Please call for outpatient follow up and testing for other causes of your symptoms including hiv testing. You will need to have these tests rechecked as an outpatient in 1 week Sodium, white blood cell count, and cxr. Please return if you are worse especially short of breath or unable to keep liquids or medicine down.

## 2020-07-15 NOTE — ED Provider Notes (Signed)
[facility]-EMERGENCY DEPT Provider Note   CSN: [id number] Arrival date & time: 07/14/20  1824     History Chief Complaint  Patient presents with  . ribcage pain    right  . Anorexia    Daniel Keller is a 49 y.o. male.  HPI   49 yo male with one week ho fatigue, f/b nausea and decreased appetite.  Patient states he has had no energy.  Endorses cough productive of clear to gray sputum.  Denies dyspnea, diarrhea, fever. Endorses chills. Some weight loss inpast six mnths but unable to quantify. Denies covid vaccine. No known covid vaccine.       History reviewed. No pertinent past medical history. none There are no problems to display for this patient.   History reviewed. No pertinent surgical history.     History reviewed. No pertinent family history.  Social History   Tobacco Use  . Smoking status: Current Every Day Smoker    Packs/day: 1.00    Types: Cigarettes  . Smokeless tobacco: Never Used  Substance Use Topics  . Alcohol use: Yes  . Drug use: No   States drinks 2-3 beers per day, but none in past 6 months.  Home Medications Prior to Admission medications   Medication Sig Start Date End Date Taking? Authorizing Provider  clindamycin (CLEOCIN) 150 MG capsule Take 2 capsules (300 mg total) by mouth 3 (three) times daily. 03/07/17   Elpidio Anis, PA-C  HYDROcodone-acetaminophen (HYCET) 7.5-325 mg/15 ml solution Take 10-15 mLs by mouth 4 (four) times daily as needed for moderate pain. 03/07/17   Elpidio Anis, PA-C    Allergies    Patient has no known allergies.  Review of Systems   Review of Systems  HENT: Negative.   Eyes: Negative.   Respiratory: Positive for cough. Negative for shortness of breath.   Cardiovascular: Positive for chest pain.       With coughing  Gastrointestinal: Positive for nausea and vomiting.       Vomited wednesday  Endocrine: Negative.   Genitourinary: Negative.   Musculoskeletal: Negative.     Skin: Negative.   Allergic/Immunologic: Negative.  Negative for immunocompromised state.  Neurological: Negative.   Hematological: Negative.   Psychiatric/Behavioral: Negative.   All other systems reviewed and are negative.   Physical Exam Updated Vital Signs BP 107/65   Pulse 90   Temp 98.9 F (37.2 C)   Resp 17   Ht 1.803 m (5\' 11" )   Wt 71.7 kg   SpO2 93%   BMI 22.04 kg/m   Physical Exam Vitals and nursing note reviewed.  Constitutional:      Appearance: He is well-developed.  HENT:     Head: Normocephalic and atraumatic.     Right Ear: External ear normal.     Left Ear: External ear normal.     Nose: Nose normal.  Eyes:     Conjunctiva/sclera: Conjunctivae normal.     Pupils: Pupils are equal, round, and reactive to light.  Cardiovascular:     Rate and Rhythm: Normal rate and regular rhythm.     Heart sounds: Normal heart sounds.  Pulmonary:     Effort: Pulmonary effort is normal. No respiratory distress.     Breath sounds: Normal breath sounds. No wheezing.  Chest:     Chest wall: No tenderness.  Abdominal:     General: Bowel sounds are normal. There is no distension.     Palpations: Abdomen is soft. There is no mass.  Tenderness: There is no abdominal tenderness. There is no guarding.  Musculoskeletal:        General: Normal range of motion.     Cervical back: Normal range of motion and neck supple.  Skin:    General: Skin is warm and dry.  Neurological:     Mental Status: He is alert and oriented to person, place, and time.     Motor: No abnormal muscle tone.     Coordination: Coordination normal.     Deep Tendon Reflexes: Reflexes are normal and symmetric.  Psychiatric:        Behavior: Behavior normal.        Thought Content: Thought content normal.        Judgment: Judgment normal.     ED Results / Procedures / Treatments   Labs (all labs ordered are listed, but only abnormal results are displayed) Labs Reviewed  SARS CORONAVIRUS 2 BY  RT PCR (HOSPITAL ORDER, PERFORMED IN [facility])    EKG None  Radiology DG Chest 2 View  Result Date: 07/15/2020 CLINICAL DATA:  Cough EXAM: CHEST - 2 VIEW COMPARISON:  None. FINDINGS: Patchy bilateral opacities. No pleural effusion. No pneumothorax. Cardiomediastinal contours are within normal limits with normal heart size. No acute osseous abnormality. IMPRESSION: Patchy bilateral opacities, which could reflect atypical/viral pneumonia. Electronically Signed   By: Guadlupe Spanish M.D.   On: 07/15/2020 08:16    Procedures Procedures (including critical care time)  Medications Ordered in ED Medications  acetaminophen (TYLENOL) tablet 650 mg (has no administration in time range)    ED Course  I have reviewed the triage vital signs and the nursing notes.  Pertinent labs & imaging results that were available during my care of the patient were reviewed by me and considered in my medical decision making (see chart for details).    MDM Rules/Calculators/A&P                          Patient presents with 2 weeks of malaise and fatigue with decreased oral intake due to poor appetite.  Denies ho hiv or risk factors.  Covid negative here.  Labs normal except leukopenia and mild hyponatremia.   Patient advised re symptomatic treatment.  Increased po intake and need for follow up- recheck wbc, hiv testing, and recheck cxr and sodium Plan cover for cap with amxicillin and doxy Patient advised of need for close follow up and return precautions Final Clinical Impression(s) / ED Diagnoses Final diagnoses:  Community acquired pneumonia, unspecified laterality  Hyponatremia  Leukopenia, unspecified type    Rx / DC Orders ED Discharge Orders    None       Margarita Grizzle, MD 07/15/20 [phone]

## 2022-01-01 ENCOUNTER — Ambulatory Visit (HOSPITAL_COMMUNITY)
Admission: EM | Admit: 2022-01-01 | Discharge: 2022-01-01 | Disposition: A | Discharge status: Home / Self Care | Payer: Self-pay | Attending: Family Medicine | Admitting: Family Medicine

## 2022-01-01 ENCOUNTER — Encounter (HOSPITAL_COMMUNITY): Payer: Self-pay

## 2022-01-01 ENCOUNTER — Other Ambulatory Visit: Payer: Self-pay

## 2022-01-01 DIAGNOSIS — B350 Tinea barbae and tinea capitis: Secondary | ICD-10-CM | POA: Insufficient documentation

## 2022-01-01 LAB — COMPREHENSIVE METABOLIC PANEL
ALT: 23 U/L (ref 0–44)
AST: 25 U/L (ref 15–41)
Albumin: 3.5 g/dL (ref 3.5–5.0)
Alkaline Phosphatase: 95 U/L (ref 38–126)
Anion gap: 5 (ref 5–15)
BUN: 7 mg/dL (ref 6–20)
CO2: 28 mmol/L (ref 22–32)
Calcium: 9.1 mg/dL (ref 8.9–10.3)
Chloride: 107 mmol/L (ref 98–111)
Creatinine, Ser: 1.04 mg/dL (ref 0.61–1.24)
GFR, Estimated: >60 mL/min (ref >60)
Glucose, Bld: 169 mg/dL — ABNORMAL HIGH (ref 70–99)
Potassium: 5.6 mmol/L — ABNORMAL HIGH (ref 3.5–5.1)
Sodium: 140 mmol/L (ref 135–145)
Total Bilirubin: 0.6 mg/dL (ref 0.3–1.2)
Total Protein: 6.2 g/dL — ABNORMAL LOW (ref 6.5–8.1)

## 2022-01-01 MED ORDER — GRISEOFULVIN MICROSIZE 500 MG PO TABS: 500.0000 mg | ORAL_TABLET | Freq: Every day | ORAL | 0 refills | Status: DC

## 2022-01-01 NOTE — ED Triage Notes (Signed)
Pt presents to the office for insect bite on head x 1 week.

## 2022-01-01 NOTE — Discharge Instructions (Addendum)
You have a kerion, which is an abscess that forms with a fungal infection.  This is treated by a 6 week course of an antifungal.  Given that you will be on this medicine for some time, we will get some blood work to make sure that your liver function is okay and can be monitored, as this medicine can affect your liver.  Keep the area clean and dry, but if it is draining pus, you can continue to put gauze over it lightly.  Go to [email or web address] to find a new primary care provider.  If you are not improving over the next few weeks, you can come back here or you can be seen by your regular doctor.  If you develop fevers and are worsening a lot, you should go to the Emergency Room.

## 2022-01-01 NOTE — ED Provider Notes (Signed)
[facility]    CSN: [id number] Arrival date & time: 01/01/22  0831      History   Chief Complaint Chief Complaint  Patient presents with   Insect Bite    HPI Daniel Keller is a 51 y.o. male.   Knot on head Noticed last week Not sure what started it No fevers or chills Has been draining pus over the last few days Otherwise feeling well No other areas like this Finds it painful to touch Hasn't had anything like this before   History reviewed. No pertinent past medical history.  There are no problems to display for this patient.   History reviewed. No pertinent surgical history.     Home Medications    Prior to Admission medications   Medication Sig Start Date End Date Taking? Authorizing Provider  griseofulvin (GRIFULVIN V) 500 MG tablet Take 1 tablet (500 mg total) by mouth daily. 01/01/22 02/12/22 Yes Mandeep Ferch, Solmon Ice, DO  amoxicillin (AMOXIL) 500 MG capsule Take 2 capsules (1,000 mg total) by mouth 2 (two) times daily. 07/15/20   Margarita Grizzle, MD  clindamycin (CLEOCIN) 150 MG capsule Take 2 capsules (300 mg total) by mouth 3 (three) times daily. 03/07/17   Elpidio Anis, PA-C  doxycycline (VIBRAMYCIN) 100 MG capsule Take 1 capsule (100 mg total) by mouth in the morning, at noon, and at bedtime. 07/15/20   Margarita Grizzle, MD  HYDROcodone-acetaminophen (HYCET) 7.5-325 mg/15 ml solution Take 10-15 mLs by mouth 4 (four) times daily as needed for moderate pain. 03/07/17   Elpidio Anis, PA-C    Family History History reviewed. No pertinent family history.  Social History Social History   Tobacco Use   Smoking status: Every Day    Packs/day: 1.00    Types: Cigarettes   Smokeless tobacco: Never  Substance Use Topics   Alcohol use: Yes   Drug use: No     Allergies   Patient has no known allergies.   Review of Systems Review of Systems  All other systems reviewed and are negative. Per HPI  Physical Exam Triage Vital Signs ED Triage  Vitals  Enc Vitals Group     BP      Pulse      Resp      Temp      Temp src      SpO2      Weight      Height      Head Circumference      Peak Flow      Pain Score      Pain Loc      Pain Edu?      Excl. in GC?    No data found.  Updated Vital Signs BP 114/76 (BP Location: Left Arm)    Pulse 79    Temp 98.3 F (36.8 C) (Oral)    Resp 16    SpO2 100%   Visual Acuity Right Eye Distance:   Left Eye Distance:   Bilateral Distance:    Right Eye Near:   Left Eye Near:    Bilateral Near:     Physical Exam Vitals and nursing note reviewed.  Constitutional:      General: He is not in acute distress.    Appearance: He is well-developed.  HENT:     Head: Normocephalic and atraumatic.  Eyes:     Conjunctiva/sclera: Conjunctivae normal.  Cardiovascular:     Rate and Rhythm: Normal rate and regular rhythm.     Heart  sounds: No murmur heard. Pulmonary:     Effort: Pulmonary effort is normal. No respiratory distress.     Breath sounds: Normal breath sounds.  Abdominal:     Palpations: Abdomen is soft.     Tenderness: There is no abdominal tenderness.  Musculoskeletal:        General: No swelling.     Cervical back: Neck supple.  Lymphadenopathy:     Cervical: No cervical adenopathy.  Skin:    General: Skin is warm and dry.     Capillary Refill: Capillary refill takes less than 2 seconds.     Comments: There is a 2cm circular nodule draining purulent fluid at the posterior portion of the scalp with some tenderness to palpation, no surrounding erythema, there are some other areas of the scalp with scaling of skin noted  Neurological:     Mental Status: He is alert.  Psychiatric:        Mood and Affect: Mood normal.     UC Treatments / Results  Labs (all labs ordered are listed, but only abnormal results are displayed) Labs Reviewed - No data to display  EKG   Radiology No results found.  Procedures Procedures (including critical care time)  Medications  Ordered in UC Medications - No data to display  Initial Impression / Assessment and Plan / UC Course  I have reviewed the triage vital signs and the nursing notes.  Pertinent labs & imaging results that were available during my care of the patient were reviewed by me and considered in my medical decision making (see chart for details).     Exam and history most consistent with a kerion.  No systemic symptoms and VSS.  Will treat with griseofulvin for 6 weeks.  Patient denies history of liver problems, but will go ahead and obtain baseline CMP for LFTs to ensure that this is appropriate to continue medication.  See return precautions and ED precautions on AVS.  Final Clinical Impressions(s) / UC Diagnoses   Final diagnoses:  Kerion     Discharge Instructions      You have a kerion, which is an abscess that forms with a fungal infection.  This is treated by a 6 week course of an antifungal.  Given that you will be on this medicine for some time, we will get some blood work to make sure that your liver function is okay and can be monitored, as this medicine can affect your liver.  Keep the area clean and dry, but if it is draining pus, you can continue to put gauze over it lightly.  Go to [email or web address] to find a new primary care provider.  If you are not improving over the next few weeks, you can come back here or you can be seen by your regular doctor.  If you develop fevers and are worsening a lot, you should go to the Emergency Room.     ED Prescriptions     Medication Sig Dispense Auth. Provider   griseofulvin (GRIFULVIN V) 500 MG tablet Take 1 tablet (500 mg total) by mouth daily. 42 tablet Sylvia Helms, Solmon Ice, DO      PDMP not reviewed this encounter.   Adin Laker, Solmon Ice, DO 01/01/22 [phone]

## 2022-01-07 ENCOUNTER — Telehealth (HOSPITAL_COMMUNITY): Payer: Self-pay | Admitting: Internal Medicine

## 2022-01-07 MED ORDER — TERBINAFINE HCL 250 MG PO TABS: 250.0000 mg | ORAL_TABLET | Freq: Every day | ORAL | 0 refills | Status: AC

## 2022-01-07 NOTE — Telephone Encounter (Signed)
Medication sent to the pharmacy on file
